# Patient Record
Sex: Male | Born: 1990
Health system: Southern US, Community
[De-identification: ages and names within clinical notes are randomized; demographics above are authoritative.]

## PROBLEM LIST (undated history)

## (undated) DIAGNOSIS — T7840XA Allergy, unspecified, initial encounter: Secondary | ICD-10-CM

## (undated) DIAGNOSIS — F419 Anxiety disorder, unspecified: Secondary | ICD-10-CM

## (undated) HISTORY — DX: Anxiety disorder, unspecified: F41.9

## (undated) HISTORY — DX: Allergy, unspecified, initial encounter: T78.40XA

## (undated) HISTORY — PX: APPENDECTOMY: SHX54

---
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
## 2010-01-21 ENCOUNTER — Encounter: Payer: Self-pay | Admitting: Sports Medicine

## 2010-01-28 ENCOUNTER — Encounter
Admission: RE | Admit: 2010-01-28 | Discharge: 2010-01-28 | Payer: Self-pay | Source: Home / Self Care | Attending: Sports Medicine | Admitting: Sports Medicine

## 2010-01-28 ENCOUNTER — Ambulatory Visit
Admission: RE | Admit: 2010-01-28 | Discharge: 2010-01-28 | Payer: Self-pay | Source: Home / Self Care | Attending: Sports Medicine | Admitting: Sports Medicine

## 2010-01-28 DIAGNOSIS — S93409A Sprain of unspecified ligament of unspecified ankle, initial encounter: Secondary | ICD-10-CM | POA: Insufficient documentation

## 2010-02-10 ENCOUNTER — Encounter (INDEPENDENT_AMBULATORY_CARE_PROVIDER_SITE_OTHER): Payer: Self-pay | Admitting: *Deleted

## 2010-02-26 NOTE — Assessment & Plan Note (Signed)
Summary: BACK PAIN,MC   Vital Signs:  Patient profile:   20 year old male BP sitting:   110 / 63  Vitals Entered By: Lillia Pauls CMA (May 06, 2009 1:54 PM)  History of Present Illness: 20 yo M here with low back pain  Symptoms started at end of March No known injury States feels only in right side of low back Does not wake him up at night. Not associated with numbness/tingling or bowel/bladder dysfunction Pain worse with movement - specifically bending forward and twisting to his right side. Worse when getting up from sitting position Has been icing and stretching on own Had similar issue last year that was more mild and went away on its own Not doing anything with repetitive hyperextension Running track and cross country.  Allergies (verified): No Known Drug Allergies  Physical Exam  General:  Well-developed,well-nourished,in no acute distress; alert,appropriate and cooperative throughout examination Msk:  Back: No gross deformity, swelling, or bruising. no midline or bony TTP.  No TTP at SI joints. Mild right lumbar paraspinal TTP. FROM with pain on twisting R > L and with flexion. Strength 5/5 BLEs Sensation intact to light touch BLEs Reflexes 2+ and equal in patellar and achilles tendons. NV intact distally. Negative SLRs. Negative stork tests Negative log rolls bilateral  hips Positive FABers on left - tight SI joint.  Pretzel stretch + here as well.   Impression & Recommendations:  Problem # 1:  LOW BACK PAIN, MILD (ICD-724.2) Assessment New Musculoskeletal low back pain - no red flags and no risk factors/evidence stress fracture.  Shown stretches and handout provided.  Ibuprofen or aleve for pain.  Heat for 15 minutes at a time 3-4 times a day at least.  Stay as active as possible.  Discussed would refer for physical therapy if he does not improve with this.  Activity as tolerated.  Problem # 2:  SACROILIITIS, LEFT (ICD-720.2) Assessment: New Pretzel  stretch and handout provided - advised to do one other stretch amount piriformis/it band/si stretches and do 3 x 30 seconds at least once a day.

## 2010-02-26 NOTE — Letter (Signed)
Summary: Out of [facility]  [facility]  [address]   [place], [place] [address]   Phone: [phone]  Fax: [phone]    April 04, 2009   Student:  Harmon Pier    To Whom It May Concern:   For Medical reasons, please excuse the above named student from school for the following dates:  Start:   April 04, 2009  End:    April 04, 2009 10:01 AM   If you need additional information, please feel free to contact our office.   Sincerely,    Lillia Pauls CMA    ****This is a legal document and cannot be tampered with.  Schools are authorized to verify all information and to do so accordingly.

## 2010-02-26 NOTE — Consult Note (Signed)
Summary: [facility]  [facility]   Imported By: Marily Memos 01/29/2010 08:40:34  _____________________________________________________________________  External Attachment:    Type:   Image     Comment:   External Document

## 2010-02-26 NOTE — Assessment & Plan Note (Signed)
Summary: F/U,MC   Vital Signs:  Patient profile:   20 year old male BP sitting:   104 / 66  Vitals Entered By: Lillia Pauls CMA (April 04, 2009 9:15 AM)  CC:  right foot pain.  History of Present Illness: Pt here for right foot pain.  This is a new issue, a little different then the previous problems he had with the feet.  The pain has been going on for about 1 week, it is located over the 2nd and 3rd digit and metatarsals, he feels it mostly when he plantar flexes up onto his toes. He says it feels like the bones are collapsing in on each other almost locking up.  Normal walking doesn't seem to hurt it, he hasn't tried jumping.  Says he has done pretty well with his orthotics up until this new issue.  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 Neuro:  Complains of tingling; denies numbness.  Physical Exam  General:  Well-developed,well-nourished,in no acute distress; alert,appropriate and cooperative throughout examination Msk:  b/l hammer toes 5th digit  b/l transverse arch breakdown, more so ont he right, callus formation on plantar surface under halux, no other callusing excessive motion in the 3rd and 4th mtp joint  pt has mild TTP over the dorsal surface of the 3rd MTP and digit pain and weakness while flexing the 3rd digit, 4/5 extension is 5/5 with mild tenderness  ankle ROM full no TTP  strength 5/5 throughout ankle joint Neurologic:  sensation intact Psych:  Cognition and judgment appear intact. Alert and cooperative with normal attention span and concentration.   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

## 2010-02-26 NOTE — Assessment & Plan Note (Signed)
Summary: 11:30 APPT PER [facility],LEFT ANKLE PAIN,MC   Vital Signs:  Patient profile:   20 year old male Height:      75 inches Weight:      190 pounds BMI:     23.83 BP sitting:   123 / 77  Vitals Entered By: Lillia Pauls CMA (January 28, 2010 11:54 AM)   History of Present Illness: 1 week ago had inversion ankle sprain playing basketball This was evaluated at Ortho urgent care no fx noted put into cam walker and has worn since but p 3 days could not walk with this and had to use crutches still marked swelling still too much pain to fully WB  no prior fxs or ankle injuries other than minor sprains  Preventive Screening-Counseling & Management  Alcohol-Tobacco     Smoking Status: never  Allergies (verified): No Known Drug Allergies  Social History: Smoking Status:  never  Physical Exam  General:  Well-developed,well-nourished,in no acute distress; alert,appropriate and cooperative throughout examination Msk:  Marked discoloration and swelling of Left foot, ankle and up to 6 inches above ankle on left TTP over fibula about 6 in above lat malleolus fiib head neg  squeeze test neg  malleolus less tender than fibula  no laxity but difficult to test ligaments 2/2 swelling  no TTP over nav, cuboid or base of 5th  unable to walk even 2 steps  Additional Exam:  MSK [place] swelling noted in soft tissue no fib fracture seen no tendon tears noted over peroneals syndesmoisis appears intact  Ankle and lower leg Xrays repeated and are wnl   Impression & Recommendations:  Problem # 1:  ANKLE SPRAIN, LEFT (ICD-845.00)  While he has + [place] rules this still on evaluation seems likely to be a sprain neg xrays x2; neg MSK [place]  switch to air cast and use until cleared as needed crutch use - advance to crutch walking try xtrain on bike or pool if tolerated begin motion exercises try contrast baths  reck  by me or team MD In 2 wks  Orders: [place] LIMITED ([id number])  Problem  # 2:  LEG PAIN, LEFT (ICD-729.5)  Clinically his exam suggests fibular fx  with neg images will follow unless sxs fail to resolve  air cast does cover tender area  Orders: Diagnostic X-Ray/Fluoroscopy (Diagnostic X-Ray/Flu) Aircast Ankle Brace ([id number]) [place] LIMITED ([id number])   Orders Added: 1)  Diagnostic X-Ray/Fluoroscopy [Diagnostic X-Ray/Flu] 2)  Aircast Ankle Brace [L4350] 3)  Est. Patient Level III [[id number]] 4)  [place] LIMITED [[id number]]

## 2010-02-26 NOTE — Letter (Signed)
Summary: Out of [facility]  [facility]  [address]   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 If you need additional information, please feel free to contact our office.   Sincerely,  Sibyl Parr. Fields, M.D.     ****This is a legal document and cannot be tampered with.  Schools are authorized to verify all information and to do so accordingly.  Appended Document: Out of School faxed the above letter to [employer], trainer at [employer] at [phone]

## 2010-06-12 NOTE — Discharge Summary (Signed)
Todd Mueller, Todd Mueller               ACCOUNT NO.:  [id number]   MEDICAL RECORD NO.:  [id number]          PATIENT TYPE:  INP   LOCATION:  6118                         FACILITY:  [facility]   PHYSICIAN:  Prabhakar D. Pendse, M.D.DATE OF BIRTH:  05/31/1990   DATE OF ADMISSION:  01/04/2005  DATE OF DISCHARGE:  01/06/2005                                 DISCHARGE SUMMARY   This 20 year old boy was seen in the emergency room on January 04, 2005,  with about 36 hours' history in right lower quadrant abdominal pains  associated with nausea, anorexia and vomiting and low-grade fever.  He had  no symptoms of URI, dysuria or diarrhea.  His past history was unremarkable.  When seen by Dr. Merri Brunette at [facility], he had  localizing right lower quadrant tenderness.  CBC was done,which showed a  white count of 13,600, and CT scan of the abdomen was done which showed  evidence of acute appendicitis, and this patient was referred.   Physical examination on admission was consistent with acute abdomen  consistent with acute appendicitis.  The patient was prepared for surgery  and exploratory laparotomy was done.  The operative field were consistent  with localized perforation of the appendix, which was covered with omentum.  The appendectomy was done in the routine fashion.  The abdominal cavity was  irrigated.  Postoperatively the patient was placed on IV Unasyn and  gentamicin.  The patient's postoperative course was uneventful.  He remained  afebrile, started tolerating clear liquids the first postoperative day and  full liquids on the second postoperative day.  The incision showed signs of  primary healing, hence appropriate instructions were given to the parent  regarding the care of the incision and the patient was discharged to be  followed as an outpatient.   FINAL DIAGNOSIS:  Acute appendicitis with localized sealed perforation.   OPERATION PERFORMED:  Exploratory laparotomy  and appendectomy.   CONDITION ON DISCHARGE:  Improved.   Diet and activity as tolerated.   MEDICATIONS:  Motrin for pain and prescription for Tylenol No. 3 one q.6h.  p.r.n.  He will return to office for follow-up examination in one week.           ______________________________  Hyman Bible. Levie Heritage, M.D.     PDP/MEDQ  D:  01/06/2005  T:  01/08/2005  Job:  [id number]   cc:   Dario Guardian, M.D.  Fax: [phone]

## 2010-06-12 NOTE — H&P (Signed)
Todd Mueller, Todd Mueller               ACCOUNT NO.:  [id number]   MEDICAL RECORD NO.:  [id number]          PATIENT TYPE:  INP   LOCATION:  6118                         FACILITY:  [facility]   PHYSICIAN:  Prabhakar D. Pendse, M.D.DATE OF BIRTH:  October 22, 1990   DATE OF ADMISSION:  01/04/2005  DATE OF DISCHARGE:                                HISTORY & PHYSICAL   This 20 year old boy was referred by Dr. Merri Brunette of [facility] group with about 30 hours' history of progressively worse mid and  lower abdominal pains associated with nausea, anorexia and one episode of  vomiting.  The patient had no fever and no other GI or respiratory symptoms.  His past history showed he had adenoidectomy at age 66.  He had fracture of  the left wrist, which was casted about a week ago, and dislocation of the  right little finger.  No other major medical problems.   No drug allergies.   PHYSICAL EXAMINATION:  GENERAL:  A 20 year old boy in moderate degree of  pain in the lower abdomen.  VITAL SIGNS:  His temperature was 98.8, pulse 80, respirations 18, blood  pressure 119/70.  Height was 5 feet 5 inches and weight was 135 pounds.  HEENT:  No evidence of infection.  NECK:  Supple.  CHEST:  Symmetrical.  Lungs clear to percussion and auscultation/  CARDIAC:  Heart size, normal regular rhythm.  No murmurs.  ABDOMEN:  Flat, diffuse tenderness of the lower abdomen with localization of  the right lower quadrant area.  There were no palpable masses.  GENITOURINARY:  External genitalia within normal limits.  RECTAL:  examination not done.   His white count done and Dr. Michaelle Copas office showed total of 13,600 with  shift to the left.  A urinalysis was not reported.  CT scan of the abdomen  done at [facility] showed evidence of acute appendicitis.   ADMITTING DIAGNOSIS:  Acute appendicitis without perforation.   PLAN:  1.  Give IV fluids.  2.  Unasyn 3 g IV.  3.  Exploratory  laparotomy.           ______________________________  Hyman Bible Levie Heritage, M.D.    PDP/MEDQ  D:  01/05/2005  T:  01/06/2005  Job:  [id number]   cc:   Dario Guardian, M.D.  Fax: [phone]

## 2010-06-12 NOTE — Op Note (Signed)
Todd Mueller, Todd Mueller               ACCOUNT NO.:  [id number]   MEDICAL RECORD NO.:  [id number]          PATIENT TYPE:  INP   LOCATION:  6118                         FACILITY:  [facility]   PHYSICIAN:  Prabhakar D. Pendse, M.D.DATE OF BIRTH:  10-19-1990   DATE OF PROCEDURE:  01/04/2005  DATE OF DISCHARGE:                                 OPERATIVE REPORT   PREOPERATIVE DIAGNOSIS:  Acute appendicitis.   POSTOPERATIVE DIAGNOSIS:  Acute appendicitis.   OPERATION PERFORMED:  Exploratory laparotomy and appendectomy.   SURGEON:  Prabhakar D. Levie Heritage, M.D.   ASSISTANT:  Donzetta Sprung, R.N., F.A.   ANESTHESIA:  Nurse.   OPERATIVE FINDINGS:  Exploration of the right lower quadrant area showed the  appendix to be about 4 inches long with omentum wrapped around the appendix  and then a small ulceration of the serosa suggestive of early minimal  perforation.  The examination of the distal ileum showed no evidence of  ileitis or Meckel's diverticulum.   OPERATIVE PROCEDURE:  Under satisfactory general endotracheal anesthesia,  patient in supine position, abdomen thoroughly prepped and draped in the  usual manner.  About a 2 inch long transverse incision was made in the right  lower quadrant area.  Skin and subcutaneous tissue incised.  Bleeders  individually clamped, cut and electrocoagulated.  Muscles incised in the  McBurney fashion.  Peritoneal cavity entered.  Exploration revealed the  findings were as described above.  Initially, it was difficult to find the  appendix because of the marked redundancy of the colon.  Once the appendix  was located, the above-mentioned findings were confirmed.  It was necessary  to extend the incision laterally about 1 cm.  After appropriate exposure,  appendix was delivered out of the incision.  Appendicular mesentery was  serially clamped, cut and ligated with 2-0 silk.  Appendectomy done in the  routine fashion.  Stump was buried in the cecal wall with a 2-0  silk  pursestring suture. Hemostasis accomplished.  The bowel was returned to the  parent cavity. Irrigation was carried out. Sponge and needle count being  correct, peritoneum closed with 2-0 Vicryl running interlocking sutures,  individual muscles with 2-0 Vicryl interrupted  sutures, subcutaneous tissue with 4-0 Vicryl, skin closed with 4-0 Monocryl  subcuticular sutures. Steri-Strips applied.  Appropriate dressing applied.  Throughout the procedure, the patient's vital signs remained stable.  The  patient withstood the procedure well and was transferred to the recovery  room in satisfactory general condition.           ______________________________  Hyman Bible Levie Heritage, M.D.     PDP/MEDQ  D:  01/04/2005  T:  01/05/2005  Job:  [id number]   cc:   Dario Guardian, M.D.  Fax: [phone]

## 2010-11-16 ENCOUNTER — Encounter: Payer: Self-pay | Admitting: Family Medicine

## 2010-11-16 ENCOUNTER — Ambulatory Visit (INDEPENDENT_AMBULATORY_CARE_PROVIDER_SITE_OTHER): Payer: PRIVATE HEALTH INSURANCE | Admitting: Family Medicine

## 2010-11-16 VITALS — BP 119/69 | HR 70 | Ht 75.0 in | Wt 190.0 lb

## 2010-11-16 DIAGNOSIS — Q667 Congenital pes cavus, unspecified foot: Secondary | ICD-10-CM

## 2010-11-16 DIAGNOSIS — M7750 Other enthesopathy of unspecified foot: Secondary | ICD-10-CM

## 2010-11-16 DIAGNOSIS — M7740 Metatarsalgia, unspecified foot: Secondary | ICD-10-CM

## 2010-11-16 NOTE — Progress Notes (Signed)
  Subjective:    Patient ID: Todd Mueller, male    DOB: 30-Jun-1990, 20 y.o.   MRN: [id number]  HPI  Bilateral foot pain and has been well treated with sports insoles and metatarsal pads. He is pretty much worn through the last 2 pair. He is a Psychologist, clinical at [employer].  Review of Systems No unusual weight change.    Objective:   Physical Exam Vital signs reviewed. GENERAL: Well developed, well nourished, no acute distress FEET: Mildly tender to palpation across the metatarsal head area. Loss transverse arch bilaterally. The longitudinal arch is high such that he has a cavus foot..Neurovascularly intact foot.       Assessment & Plan:  Talipes cavus Metatarsalgia. Gave him  2 pair of sports insoles with metatarsal pads.

## 2012-12-20 ENCOUNTER — Encounter: Payer: Self-pay | Admitting: Sports Medicine

## 2012-12-20 ENCOUNTER — Ambulatory Visit (INDEPENDENT_AMBULATORY_CARE_PROVIDER_SITE_OTHER): Payer: PRIVATE HEALTH INSURANCE | Admitting: Sports Medicine

## 2012-12-20 ENCOUNTER — Encounter (INDEPENDENT_AMBULATORY_CARE_PROVIDER_SITE_OTHER): Payer: Self-pay

## 2012-12-20 VITALS — BP 132/69 | HR 81 | Ht 75.0 in | Wt 190.0 lb

## 2012-12-20 DIAGNOSIS — M79609 Pain in unspecified limb: Secondary | ICD-10-CM

## 2012-12-20 DIAGNOSIS — M7740 Metatarsalgia, unspecified foot: Secondary | ICD-10-CM

## 2012-12-20 DIAGNOSIS — M7750 Other enthesopathy of unspecified foot: Secondary | ICD-10-CM

## 2012-12-20 DIAGNOSIS — M2040 Other hammer toe(s) (acquired), unspecified foot: Secondary | ICD-10-CM

## 2012-12-20 DIAGNOSIS — Q667 Congenital pes cavus, unspecified foot: Secondary | ICD-10-CM

## 2012-12-20 NOTE — Assessment & Plan Note (Addendum)
Patient was fitted for a : standard, cushioned, semi-rigid orthotic. The orthotic was heated and afterward the patient stood on the orthotic blank positioned on the orthotic stand. The patient was positioned in subtalar neutral position and 10 degrees of ankle dorsiflexion in a weight bearing stance. After completion of molding, a stable base was applied to the orthotic blank. The blank was ground to a stable position for weight bearing. Size: 14 Base: Red Posting: Blue EVA Additional orthotic padding: None  Patient was comfortable and walking gait was neutral in orthotics  Preparation time was 40 minutes

## 2012-12-20 NOTE — Progress Notes (Signed)
Patient ID: Todd Mueller, male   DOB: 09/15/1990, 22 y.o.   MRN: [id number]  Todd Mueller returns from [facility] for new orthotics  He has had multiple foot problems for many years. This relates to his cavus foot. However as long as we've kept him in custom orthotics for sports we have been able to control most of the pain. He does continue use metatarsal pads for his metatarsalgia. His hammertoes and not worsened.  Physical exam  Athletic male in no acute distress  He has good alignment but has a significant cavus foot bilaterally  Hammertoes are mild and not increased  No abnormal callusing  Normal ankle and foot motion

## 2012-12-20 NOTE — Assessment & Plan Note (Signed)
Mrs. stabilize with good transverse arch support

## 2012-12-20 NOTE — Assessment & Plan Note (Signed)
Metatarsal pads were placed for orthotics and insoles

## 2012-12-20 NOTE — Assessment & Plan Note (Signed)
Using sports insoles in non athletic shoes and this helps control pain

## 2016-11-30 DIAGNOSIS — F419 Anxiety disorder, unspecified: Secondary | ICD-10-CM | POA: Insufficient documentation

## 2018-10-18 ENCOUNTER — Ambulatory Visit (INDEPENDENT_AMBULATORY_CARE_PROVIDER_SITE_OTHER): Payer: 59 | Admitting: Registered Nurse

## 2018-10-18 ENCOUNTER — Other Ambulatory Visit: Payer: Self-pay

## 2018-10-18 ENCOUNTER — Encounter: Payer: Self-pay | Admitting: Registered Nurse

## 2018-10-18 VITALS — BP 129/80 | HR 77 | Temp 98.7°F | Resp 16 | Ht 75.0 in | Wt 186.0 lb

## 2018-10-18 DIAGNOSIS — Z0001 Encounter for general adult medical examination with abnormal findings: Secondary | ICD-10-CM | POA: Diagnosis not present

## 2018-10-18 DIAGNOSIS — Z13228 Encounter for screening for other metabolic disorders: Secondary | ICD-10-CM | POA: Diagnosis not present

## 2018-10-18 DIAGNOSIS — Z23 Encounter for immunization: Secondary | ICD-10-CM | POA: Diagnosis not present

## 2018-10-18 DIAGNOSIS — J3089 Other allergic rhinitis: Secondary | ICD-10-CM | POA: Diagnosis not present

## 2018-10-18 DIAGNOSIS — Z130 Encounter for screening for diseases of the blood and blood-forming organs and certain disorders involving the immune mechanism: Secondary | ICD-10-CM | POA: Diagnosis not present

## 2018-10-18 DIAGNOSIS — Z13220 Encounter for screening for lipoid disorders: Secondary | ICD-10-CM | POA: Diagnosis not present

## 2018-10-18 DIAGNOSIS — Z1329 Encounter for screening for other suspected endocrine disorder: Secondary | ICD-10-CM

## 2018-10-18 DIAGNOSIS — Z0000 Encounter for general adult medical examination without abnormal findings: Secondary | ICD-10-CM

## 2018-10-18 MED ORDER — FLUTICASONE PROPIONATE 50 MCG/ACT NA SUSP: 2.0000 | Freq: Every day | NASAL | 6 refills | Status: AC

## 2018-10-18 MED ORDER — AZELASTINE HCL 0.1 % NA SOLN: 1.0000 | Freq: Two times a day (BID) | NASAL | 12 refills | Status: AC

## 2018-10-18 NOTE — Patient Instructions (Signed)
° ° ° °  If you have lab work done today you will be contacted with your lab results within the next 2 weeks.  If you have not heard from us then please contact us. The fastest way to get your results is to register for My Chart. ° ° °IF you received an x-ray today, you will receive an invoice from [facility]. Please contact [facility] at [phone] with questions or concerns regarding your invoice.  ° °IF you received labwork today, you will receive an invoice from [facility]. Please contact [facility] at [phone] with questions or concerns regarding your invoice.  ° °Our billing staff will not be able to assist you with questions regarding bills from these companies. ° °You will be contacted with the lab results as soon as they are available. The fastest way to get your results is to activate your My Chart account. Instructions are located on the last page of this paperwork. If you have not heard from us regarding the results in 2 weeks, please contact this office. °  ° ° ° °

## 2018-10-18 NOTE — Progress Notes (Signed)
Established Patient Office Visit  Subjective:  Patient ID: Todd Mueller, male    DOB: 02-10-1990  Age: 28 y.o. MRN: [id number]  CC:  Chief Complaint  Patient presents with  . Annual Exam    HPI Todd Mueller presents for annual exam. No current complaints.  No major medical history. 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   Past Medical History:  Diagnosis Date  . Allergy   . Anxiety     Past Surgical History:  Procedure Laterality Date  . APPENDECTOMY      History reviewed. No pertinent family history.  Social History   Socioeconomic History  . Marital status: Married    Spouse name: Not on file  . Number of children: Not on file  . Years of education: Not on file  . Highest education level: Not on file  Occupational History  . Not on file  Social Needs  . Financial resource strain: Not hard at all  . Food insecurity    Worry: Never true    Inability: Never true  . Transportation needs    Medical: No    Non-medical: No  Tobacco Use  . Smoking status: Never Smoker  . Smokeless tobacco: Never Used  Substance and Sexual Activity  . Alcohol use: Not Currently  . Drug use: Not Currently  . Sexual activity: Not on file  Lifestyle  . Physical activity    Days per week: 5 days    Minutes per session: 60 min  . Stress: Only a little  Relationships  . Social Herbalist on phone: Not on file    Gets together: Not on file    Attends religious service: Not on file    Active member of club or organization: Not on file    Attends meetings of clubs or organizations: Not on file    Relationship status: Not on file  . Intimate partner violence    Fear of current or ex partner: Not on file    Emotionally abused: Not on file    Physically abused: Not on file    Forced sexual activity: Not on file  Other Topics Concern  . Not on file  Social History Narrative  . Not on file    Outpatient  Medications Prior to Visit  Medication Sig Dispense Refill  . propranolol (INDERAL) 10 MG tablet Take by mouth.     No facility-administered medications prior to visit.     No Known Allergies  ROS Review of Systems  Constitutional: Negative.   HENT: Negative.   Eyes: Negative.   Respiratory: Negative.   Cardiovascular: Negative.   Gastrointestinal: Negative.   Endocrine: Negative.   Genitourinary: Negative.   Musculoskeletal: Negative.   Skin: Negative.   Allergic/Immunologic: Negative.   Neurological: Negative.   Hematological: Negative.   Psychiatric/Behavioral: Negative.   All other systems reviewed and are negative.     Objective:    Physical Exam  Constitutional: He appears well-developed and well-nourished. No distress.  HENT:  Head: Normocephalic and atraumatic.  Right Ear: External ear normal.  Left Ear: External ear normal.  Nose: Nose normal.  Mouth/Throat: Oropharynx is clear and moist. No oropharyngeal exudate.  Eyes: Pupils are equal, round, and reactive to light. Conjunctivae and EOM are normal. Right eye exhibits no discharge. Left eye exhibits no discharge. No scleral icterus.  Neck: Normal range of motion. Neck supple. No tracheal deviation present. No  thyromegaly present.  Cardiovascular: Normal rate, regular rhythm, normal heart sounds and intact distal pulses. Exam reveals no gallop and no friction rub.  No murmur heard. Pulmonary/Chest: Effort normal and breath sounds normal. No respiratory distress. He has no wheezes. He has no rales. He exhibits no tenderness.  Abdominal: Soft. Bowel sounds are normal. He exhibits no distension and no mass. There is no abdominal tenderness. There is no rebound and no guarding.  Musculoskeletal: Normal range of motion.        General: No tenderness, deformity or edema.  Lymphadenopathy:    He has no cervical adenopathy.  Neurological: He is alert. No cranial nerve deficit. He exhibits normal muscle tone.  Coordination normal.  Skin: Skin is warm and dry. No rash noted. He is not diaphoretic. No erythema. No pallor.  Psychiatric: He has a normal mood and affect. His behavior is normal. Judgment and thought content normal.  Nursing note and vitals reviewed.   BP 129/80   Pulse 77   Temp 98.7 F (37.1 C) (Oral)   Resp 16   Ht '6\' 3"'$  (1.905 m)   Wt 186 lb (84.4 kg)   SpO2 97%   BMI 23.25 kg/m  Wt Readings from Last 3 Encounters:  10/18/18 186 lb (84.4 kg)  12/20/12 190 lb (86.2 kg)  11/16/10 190 lb (86.2 kg)     Health Maintenance Due  Topic Date Due  . HIV Screening  10/19/2005    There are no preventive care reminders to display for this patient.  No results found for: TSH No results found for: WBC, HGB, HCT, MCV, PLT No results found for: NA, K, CHLORIDE, CO2, GLUCOSE, BUN, CREATININE, BILITOT, ALKPHOS, AST, ALT, PROT, ALBUMIN, CALCIUM, ANIONGAP, EGFR, GFR No results found for: CHOL No results found for: HDL No results found for: LDLCALC No results found for: TRIG No results found for: CHOLHDL No results found for: HGBA1C    Assessment & Plan:   Problem List Items Addressed This Visit    None    Visit Diagnoses    Need for influenza vaccination    -  Primary   Relevant Orders   Flu Vaccine QUAD 36+ mos IM (Completed)   Screening for endocrine, metabolic and immunity disorder       Relevant Orders   CBC   Comprehensive metabolic panel   TSH   Lipid screening       Relevant Orders   Lipid panel      No orders of the defined types were placed in this encounter.   Follow-up: No follow-ups on file.   PLAN  Normal findings on exam. Pt is fit to participate in all school activities, both academic and didactic.  Labs drawn, will follow up as warranted  Patient encouraged to call clinic with any questions, comments, or concerns.   Maximiano Coss, NP

## 2018-10-19 LAB — COMPREHENSIVE METABOLIC PANEL
ALT: 24 IU/L (ref 0–44)
AST: 23 IU/L (ref 0–40)
Albumin/Globulin Ratio: 1.9 (ref 1.2–2.2)
Albumin: 4.7 g/dL (ref 4.1–5.2)
Alkaline Phosphatase: 80 IU/L (ref 39–117)
BUN/Creatinine Ratio: 17 (ref 9–20)
BUN: 17 mg/dL (ref 6–20)
Bilirubin Total: 0.8 mg/dL (ref 0.0–1.2)
CO2: 24 mmol/L (ref 20–29)
Calcium: 9.5 mg/dL (ref 8.7–10.2)
Chloride: 102 mmol/L (ref 96–106)
Creatinine, Ser: 1.02 mg/dL (ref 0.76–1.27)
GFR calc Af Amer: 116 mL/min/{1.73_m2} (ref >59)
GFR calc non Af Amer: 100 mL/min/{1.73_m2} (ref >59)
Globulin, Total: 2.5 g/dL (ref 1.5–4.5)
Glucose: 87 mg/dL (ref 65–99)
Potassium: 4.3 mmol/L (ref 3.5–5.2)
Sodium: 141 mmol/L (ref 134–144)
Total Protein: 7.2 g/dL (ref 6.0–8.5)

## 2018-10-19 LAB — CBC
Hematocrit: 46.2 % (ref 37.5–51.0)
Hemoglobin: 15.8 g/dL (ref 13.0–17.7)
MCH: 30.8 pg (ref 26.6–33.0)
MCHC: 34.2 g/dL (ref 31.5–35.7)
MCV: 90 fL (ref 79–97)
Platelets: 190 10*3/uL (ref 150–450)
RBC: 5.13 x10E6/uL (ref 4.14–5.80)
RDW: 13.3 % (ref 11.6–15.4)
WBC: 4.5 10*3/uL (ref 3.4–10.8)

## 2018-10-19 LAB — LIPID PANEL
Chol/HDL Ratio: 3.5 ratio (ref 0.0–5.0)
Cholesterol, Total: 165 mg/dL (ref 100–199)
HDL: 47 mg/dL (ref >39)
LDL Chol Calc (NIH): 104 mg/dL — ABNORMAL HIGH (ref 0–99)
Triglycerides: 70 mg/dL (ref 0–149)
VLDL Cholesterol Cal: 14 mg/dL (ref 5–40)

## 2018-10-19 LAB — TSH: TSH: 1.84 u[IU]/mL (ref 0.450–4.500)

## 2018-10-20 NOTE — Progress Notes (Signed)
[place] -  If you wouldn't mind calling Mr. Kinzler to let him know his labs are back and they are good, that would be great. Additionally, I think he's scheduled for a CPE on 10/25/18, but we were able to get that done at his last visit. We can cancel his 10/25/18 visit. Thank you, Kathrin Ruddy, NP

## 2018-10-25 ENCOUNTER — Encounter: Payer: Self-pay | Admitting: Registered Nurse

## 2018-11-29 ENCOUNTER — Telehealth: Payer: PRIVATE HEALTH INSURANCE | Admitting: Family

## 2018-11-29 DIAGNOSIS — Z20822 Contact with and (suspected) exposure to covid-19: Secondary | ICD-10-CM

## 2018-11-29 DIAGNOSIS — Z20828 Contact with and (suspected) exposure to other viral communicable diseases: Secondary | ICD-10-CM

## 2018-11-29 MED ORDER — BENZONATATE 100 MG PO CAPS: 100.0000 mg | ORAL_CAPSULE | Freq: Three times a day (TID) | ORAL | 0 refills | Status: DC | PRN

## 2018-11-29 NOTE — Progress Notes (Signed)
E-Visit for Corona Virus Screening   Your current symptoms could be consistent with the coronavirus.  Many health care providers can now test patients at their office but not all are.  [place] has multiple testing sites. For information on our COVID testing locations and hours go to [email or web address]  Please quarantine yourself while awaiting your test results.  We are enrolling you in our MyChart Home Montioring for COVID19 . Daily you will receive a questionnaire within the MyChart website. Our COVID 19 response team willl be monitoriing your responses daily.  You can go to one of the testing sites listed below, while they are opened (see hours). You do not need an order and will stay in your car during the test. You do need to self isolate until your results return and if positive 10 days from when your symptoms started and until you are 3 days fever free.   Testing Locations (Monday - Friday, 8 a.m. - 3:30 p.m.) . [place]: [facility], [address], [place], [place]  . [place]: [facility], [address], [place],  (entrance [address])  . [place]: (Closed each Monday): Testing site relocated to the short stay covered drive at [facility]. (Use the [address] entrance to [facility] next to [facility].)   COVID-19 is a respiratory illness with symptoms that are similar to the flu. Symptoms are typically mild to moderate, but there have been cases of severe illness and death due to the virus. The following symptoms may appear 2-14 days after exposure: . Fever . Cough . Shortness of breath or difficulty breathing . Chills . Repeated shaking with chills . Muscle pain . Headache . Sore throat . New loss of taste or smell . Fatigue . Congestion or runny nose . Nausea or vomiting . Diarrhea  It is vitally important that if you feel that you  have an infection such as this virus or any other virus that you stay home and away from places where you may spread it to others.  You should self-quarantine for 14 days if you have symptoms that could potentially be coronavirus or have been in close contact a with a person diagnosed with COVID-19 within the last 2 weeks. You should avoid contact with people age 65 and older.   You should wear a mask or cloth face covering over your nose and mouth if you must be around other people or animals, including pets (even at home). Try to stay at least 6 feet away from other people. This will protect the people around you.  You can use medication such as A prescription cough medication called Tessalon Perles 100 mg. You may take 1-2 capsules every 8 hours as needed for cough.  You may also take acetaminophen (Tylenol) as needed for fever.   Reduce your risk of any infection by using the same precautions used for avoiding the common cold or flu:  . Wash your hands often with soap and warm water for at least 20 seconds.  If soap and water are not readily available, use an alcohol-based hand sanitizer with at least 60% alcohol.  . If coughing or sneezing, cover your mouth and nose by coughing or sneezing into the elbow areas of your shirt or coat, into a tissue or into your sleeve (not your hands). . Avoid shaking hands with others and consider head nods or verbal greetings only. . Avoid touching your   eyes, nose, or mouth with unwashed hands.  . Avoid close contact with people who are sick. . Avoid places or events with large numbers of people in one location, like concerts or sporting events. . Carefully consider travel plans you have or are making. . If you are planning any travel outside or inside the [place], visit the CDC's Travelers' Health webpage for the latest health notices. . If you have some symptoms but not all symptoms, continue to monitor at home and seek medical attention if your symptoms  worsen. . If you are having a medical emergency, call 911.  HOME CARE . Only take medications as instructed by your medical team. . Drink plenty of fluids and get plenty of rest. . A steam or ultrasonic humidifier can help if you have congestion.   GET HELP RIGHT AWAY IF YOU HAVE EMERGENCY WARNING SIGNS** FOR COVID-19. If you or someone is showing any of these signs seek emergency medical care immediately. Call 911 or proceed to your closest emergency facility if: . You develop worsening high fever. . Trouble breathing . Bluish lips or face . Persistent pain or pressure in the chest . New confusion . Inability to wake or stay awake . You cough up blood. . Your symptoms become more severe  **This list is not all possible symptoms. Contact your medical provider for any symptoms that are sever or concerning to you.   MAKE SURE YOU   Understand these instructions.  Will watch your condition.  Will get help right away if you are not doing well or get worse.  Your e-visit answers were reviewed by a board certified advanced clinical practitioner to complete your personal care plan.  Depending on the condition, your plan could have included both over the counter or prescription medications.  If there is a problem please reply once you have received a response from your provider.  Your safety is important to us.  If you have drug allergies check your prescription carefully.    You can use MyChart to ask questions about today's visit, request a non-urgent call back, or ask for a work or school excuse for 24 hours related to this e-Visit. If it has been greater than 24 hours you will need to follow up with your provider, or enter a new e-Visit to address those concerns. You will get an e-mail in the next two days asking about your experience.  I hope that your e-visit has been valuable and will speed your recovery. Thank you for using e-visits.  Approximately 5 minutes was spent documenting  and reviewing patient's chart.    

## 2019-01-08 DIAGNOSIS — S8255XA Nondisplaced fracture of medial malleolus of left tibia, initial encounter for closed fracture: Secondary | ICD-10-CM | POA: Diagnosis not present

## 2019-01-29 DIAGNOSIS — S8255XD Nondisplaced fracture of medial malleolus of left tibia, subsequent encounter for closed fracture with routine healing: Secondary | ICD-10-CM | POA: Diagnosis not present

## 2019-03-12 ENCOUNTER — Ambulatory Visit: Payer: PRIVATE HEALTH INSURANCE | Attending: Internal Medicine

## 2019-03-12 ENCOUNTER — Other Ambulatory Visit: Payer: PRIVATE HEALTH INSURANCE

## 2019-03-12 DIAGNOSIS — Z20822 Contact with and (suspected) exposure to covid-19: Secondary | ICD-10-CM | POA: Diagnosis not present

## 2019-03-13 LAB — NOVEL CORONAVIRUS, NAA: SARS-CoV-2, NAA: NOT DETECTED

## 2019-05-16 ENCOUNTER — Ambulatory Visit: Payer: 59 | Admitting: Registered Nurse

## 2019-05-21 ENCOUNTER — Encounter: Payer: 59 | Admitting: Registered Nurse

## 2019-05-23 ENCOUNTER — Encounter: Payer: Self-pay | Admitting: Registered Nurse

## 2019-05-23 ENCOUNTER — Other Ambulatory Visit: Payer: Self-pay

## 2019-05-23 ENCOUNTER — Ambulatory Visit (INDEPENDENT_AMBULATORY_CARE_PROVIDER_SITE_OTHER): Payer: 59 | Admitting: Registered Nurse

## 2019-05-23 VITALS — BP 118/73 | HR 75 | Temp 97.8°F | Resp 16 | Ht 75.0 in | Wt 182.8 lb

## 2019-05-23 DIAGNOSIS — R0683 Snoring: Secondary | ICD-10-CM

## 2019-05-23 DIAGNOSIS — Z0000 Encounter for general adult medical examination without abnormal findings: Secondary | ICD-10-CM

## 2019-05-23 DIAGNOSIS — Z1159 Encounter for screening for other viral diseases: Secondary | ICD-10-CM

## 2019-05-23 DIAGNOSIS — Z13228 Encounter for screening for other metabolic disorders: Secondary | ICD-10-CM

## 2019-05-23 DIAGNOSIS — Z0001 Encounter for general adult medical examination with abnormal findings: Secondary | ICD-10-CM

## 2019-05-23 DIAGNOSIS — Z1329 Encounter for screening for other suspected endocrine disorder: Secondary | ICD-10-CM | POA: Diagnosis not present

## 2019-05-23 DIAGNOSIS — H547 Unspecified visual loss: Secondary | ICD-10-CM | POA: Diagnosis not present

## 2019-05-23 DIAGNOSIS — Z13220 Encounter for screening for lipoid disorders: Secondary | ICD-10-CM | POA: Diagnosis not present

## 2019-05-23 DIAGNOSIS — Z111 Encounter for screening for respiratory tuberculosis: Secondary | ICD-10-CM | POA: Diagnosis not present

## 2019-05-23 DIAGNOSIS — Z130 Encounter for screening for diseases of the blood and blood-forming organs and certain disorders involving the immune mechanism: Secondary | ICD-10-CM | POA: Diagnosis not present

## 2019-05-23 NOTE — Progress Notes (Signed)
Established Patient Office Visit  Subjective:  Patient ID: Todd Mueller, male    DOB: 07/07/1990  Age: 29 y.o. MRN: [id number]  CC:  Chief Complaint  Patient presents with  . Annual Exam    physical and Tb gold testing.    HPI Todd Mueller presents for CPE and labs for school. Needs TB testing.  2 concerns arose:  Snoring: wakes up with dry mouth, wakes up gasping. Reports witnessed apnea. No headaches on waking. Significant daytime sleepiness every afternoon. Fam hx of much snoring - no one in fam has done sleep study, so no dx OSA.  Also notes loss of visual acuity in R eye more than L. No hx of injury or infection. No headaches, hx of concussion or tbi, or known reason for this to be happening.   Up to date on vaccinations, including covid vaccines   Past Medical History:  Diagnosis Date  . Allergy   . Anxiety     Past Surgical History:  Procedure Laterality Date  . APPENDECTOMY      Family History  Problem Relation Age of Onset  . Seizures Mother   . Healthy Father   . Seizures Brother   . Healthy Son     Social History   Socioeconomic History  . Marital status: Married    Spouse name: Not on file  . Number of children: Not on file  . Years of education: Not on file  . Highest education level: Not on file  Occupational History  . Not on file  Tobacco Use  . Smoking status: Never Smoker  . Smokeless tobacco: Never Used  Substance and Sexual Activity  . Alcohol use: Not Currently  . Drug use: Not Currently  . Sexual activity: Yes  Other Topics Concern  . Not on file  Social History Narrative  . Not on file   Social Determinants of Health   Financial Resource Strain: Low Risk   . Difficulty of Paying Living Expenses: Not hard at all  Food Insecurity: No Food Insecurity  . Worried About Charity fundraiser in the Last Year: Never true  . Ran Out of Food in the Last Year: Never true  Transportation Needs: No Transportation Needs  . Lack  of Transportation (Medical): No  . Lack of Transportation (Non-Medical): No  Physical Activity: Sufficiently Active  . Days of Exercise per Week: 5 days  . Minutes of Exercise per Session: 60 min  Stress: No Stress Concern Present  . Feeling of Stress : Only a little  Social Connections:   . Frequency of Communication with Friends and Family:   . Frequency of Social Gatherings with Friends and Family:   . Attends Religious Services:   . Active Member of Clubs or Organizations:   . Attends Archivist Meetings:   Marland Kitchen Marital Status:   Intimate Partner Violence:   . Fear of Current or Ex-Partner:   . Emotionally Abused:   Marland Kitchen Physically Abused:   . Sexually Abused:     Outpatient Medications Prior to Visit  Medication Sig Dispense Refill  . azelastine (ASTELIN) 0.1 % nasal spray Place 1 spray into both nostrils 2 (two) times daily. Use in each nostril as directed 30 mL 12  . fluticasone (FLONASE) 50 MCG/ACT nasal spray Place 2 sprays into both nostrils daily. 16 g 6  . benzonatate (TESSALON PERLES) 100 MG capsule Take 1 capsule (100 mg total) by mouth 3 (three) times daily as needed. Martorell  capsule 0  . propranolol (INDERAL) 10 MG tablet Take by mouth.     No facility-administered medications prior to visit.    No Known Allergies  ROS Review of Systems  Constitutional: Negative.   HENT: Negative.   Eyes: Negative.   Respiratory: Negative.   Cardiovascular: Negative.   Gastrointestinal: Negative.   Endocrine: Negative.   Genitourinary: Negative.   Musculoskeletal: Negative.   Skin: Negative.   Allergic/Immunologic: Negative.   Neurological: Negative.   Hematological: Negative.   Psychiatric/Behavioral: Negative.   All other systems reviewed and are negative.     Objective:    Physical Exam  Constitutional: He appears well-developed and well-nourished. No distress.  HENT:  Head: Normocephalic and atraumatic.  Right Ear: External ear normal.  Left Ear: External  ear normal.  Nose: Nose normal.  Mouth/Throat: Oropharynx is clear and moist. No oropharyngeal exudate.  Eyes: Pupils are equal, round, and reactive to light. Conjunctivae and EOM are normal. Right eye exhibits no discharge. Left eye exhibits no discharge. No scleral icterus.  Fundoscopic exam:      The right eye shows no arteriolar narrowing, no AV nicking, no exudate, no hemorrhage and no papilledema.       The left eye shows no arteriolar narrowing, no AV nicking, no exudate, no hemorrhage and no papilledema.  Neck: No JVD present. No tracheal deviation present. No thyromegaly present.  Cardiovascular: Normal rate, regular rhythm, normal heart sounds and intact distal pulses. Exam reveals no gallop and no friction rub.  No murmur heard. Pulmonary/Chest: Effort normal and breath sounds normal. No respiratory distress. He has no wheezes. He has no rales. He exhibits no tenderness.  Abdominal: Soft. Bowel sounds are normal. He exhibits no distension and no mass. There is no abdominal tenderness. There is no rebound and no guarding.  Musculoskeletal:     Cervical back: Normal range of motion and neck supple.  Lymphadenopathy:    He has no cervical adenopathy.  Skin: Skin is warm and dry. No rash noted. He is not diaphoretic. No erythema. No pallor.  Psychiatric: He has a normal mood and affect. His behavior is normal. Judgment and thought content normal.  Nursing note and vitals reviewed.   BP 118/73   Pulse 75   Temp 97.8 F (36.6 C) (Temporal)   Resp 16   Ht 6\' 3"  (1.905 m)   Wt 182 lb 12.8 oz (82.9 kg)   SpO2 97%   BMI 22.85 kg/m  Wt Readings from Last 3 Encounters:  05/23/19 182 lb 12.8 oz (82.9 kg)  10/18/18 186 lb (84.4 kg)  12/20/12 190 lb (86.2 kg)     Health Maintenance Due  Topic Date Due  . HIV Screening  Never done    There are no preventive care reminders to display for this patient.  Lab Results  Component Value Date   TSH 1.840 10/18/2018   Lab Results   Component Value Date   WBC 4.5 10/18/2018   HGB 15.8 10/18/2018   HCT 46.2 10/18/2018   MCV 90 10/18/2018   PLT 190 10/18/2018   Lab Results  Component Value Date   NA 141 10/18/2018   K 4.3 10/18/2018   CO2 24 10/18/2018   GLUCOSE 87 10/18/2018   BUN 17 10/18/2018   CREATININE 1.02 10/18/2018   BILITOT 0.8 10/18/2018   ALKPHOS 80 10/18/2018   AST 23 10/18/2018   ALT 24 10/18/2018   PROT 7.2 10/18/2018   ALBUMIN 4.7 10/18/2018   CALCIUM 9.5  10/18/2018   Lab Results  Component Value Date   CHOL 165 10/18/2018   Lab Results  Component Value Date   HDL 47 10/18/2018   Lab Results  Component Value Date   LDLCALC 104 (H) 10/18/2018   Lab Results  Component Value Date   TRIG 70 10/18/2018   Lab Results  Component Value Date   CHOLHDL 3.5 10/18/2018   No results found for: HGBA1C    Assessment & Plan:   Problem List Items Addressed This Visit    None    Visit Diagnoses    Annual physical exam    -  Primary   Screening for endocrine, metabolic and immunity disorder       Relevant Orders   TSH   CBC with Differential   Hemoglobin A1c   Screening-pulmonary TB       Relevant Orders   QuantiFERON-TB Gold Plus   Screening for viral disease       Relevant Orders   HIV antibody (with reflex)   Lipid screening       Relevant Orders   Lipid panel   Snoring       Relevant Orders   Ambulatory referral to Pulmonology   Decreased visual acuity       Relevant Orders   Ambulatory referral to Ophthalmology      No orders of the defined types were placed in this encounter.   Follow-up: No follow-ups on file.   PLAN  No abnormal findings on exam - fundoscopic exam also wnl.  Will refer to ophth for decreased visual acuity, pulmonology for suspected OSA  Labs collected, will follow up as warranted  Patient encouraged to call clinic with any questions, comments, or concerns.  Janeece Agee, NP

## 2019-05-23 NOTE — Patient Instructions (Signed)
° ° ° °  If you have lab work done today you will be contacted with your lab results within the next 2 weeks.  If you have not heard from us then please contact us. The fastest way to get your results is to register for My Chart. ° ° °IF you received an x-ray today, you will receive an invoice from [facility]. Please contact [facility] at [phone] with questions or concerns regarding your invoice.  ° °IF you received labwork today, you will receive an invoice from [facility]. Please contact [facility] at [phone] with questions or concerns regarding your invoice.  ° °Our billing staff will not be able to assist you with questions regarding bills from these companies. ° °You will be contacted with the lab results as soon as they are available. The fastest way to get your results is to activate your My Chart account. Instructions are located on the last page of this paperwork. If you have not heard from us regarding the results in 2 weeks, please contact this office. °  ° ° ° °

## 2019-05-25 LAB — LIPID PANEL
Chol/HDL Ratio: 3.2 ratio (ref 0.0–5.0)
Cholesterol, Total: 127 mg/dL (ref 100–199)
HDL: 40 mg/dL (ref >39)
LDL Chol Calc (NIH): 73 mg/dL (ref 0–99)
Triglycerides: 68 mg/dL (ref 0–149)
VLDL Cholesterol Cal: 14 mg/dL (ref 5–40)

## 2019-05-25 LAB — HEMOGLOBIN A1C
Est. average glucose Bld gHb Est-mCnc: 105 mg/dL
Hgb A1c MFr Bld: 5.3 % (ref 4.8–5.6)

## 2019-05-25 LAB — CBC WITH DIFFERENTIAL/PLATELET
Basophils Absolute: 0 10*3/uL (ref 0.0–0.2)
Basos: 1 %
EOS (ABSOLUTE): 0.2 10*3/uL (ref 0.0–0.4)
Eos: 6 %
Hematocrit: 45.7 % (ref 37.5–51.0)
Hemoglobin: 15.7 g/dL (ref 13.0–17.7)
Immature Grans (Abs): 0 10*3/uL (ref 0.0–0.1)
Immature Granulocytes: 0 %
Lymphocytes Absolute: 1.4 10*3/uL (ref 0.7–3.1)
Lymphs: 44 %
MCH: 31.2 pg (ref 26.6–33.0)
MCHC: 34.4 g/dL (ref 31.5–35.7)
MCV: 91 fL (ref 79–97)
Monocytes Absolute: 0.3 10*3/uL (ref 0.1–0.9)
Monocytes: 9 %
Neutrophils Absolute: 1.3 10*3/uL — ABNORMAL LOW (ref 1.4–7.0)
Neutrophils: 40 %
Platelets: 193 10*3/uL (ref 150–450)
RBC: 5.03 x10E6/uL (ref 4.14–5.80)
RDW: 13.1 % (ref 11.6–15.4)
WBC: 3.2 10*3/uL — ABNORMAL LOW (ref 3.4–10.8)

## 2019-05-25 LAB — TSH: TSH: 1.6 u[IU]/mL (ref 0.450–4.500)

## 2019-05-25 LAB — QUANTIFERON-TB GOLD PLUS
QuantiFERON Mitogen Value: >10 IU/mL
QuantiFERON Nil Value: 0.07 IU/mL
QuantiFERON TB1 Ag Value: 0.07 IU/mL
QuantiFERON TB2 Ag Value: 0.06 IU/mL
QuantiFERON-TB Gold Plus: NEGATIVE

## 2019-05-25 LAB — HIV ANTIBODY (ROUTINE TESTING W REFLEX): HIV Screen 4th Generation wRfx: NONREACTIVE

## 2019-05-30 ENCOUNTER — Encounter: Payer: Self-pay | Admitting: Registered Nurse

## 2019-07-23 ENCOUNTER — Ambulatory Visit (INDEPENDENT_AMBULATORY_CARE_PROVIDER_SITE_OTHER): Payer: 59 | Admitting: Pulmonary Disease

## 2019-07-23 ENCOUNTER — Other Ambulatory Visit: Payer: Self-pay

## 2019-07-23 ENCOUNTER — Encounter: Payer: Self-pay | Admitting: Pulmonary Disease

## 2019-07-23 DIAGNOSIS — J342 Deviated nasal septum: Secondary | ICD-10-CM | POA: Diagnosis not present

## 2019-07-23 MED ORDER — MOMETASONE FUROATE 50 MCG/ACT NA SUSP: 2.0000 | Freq: Every evening | NASAL | 1 refills | Status: DC

## 2019-07-23 NOTE — Assessment & Plan Note (Addendum)
deviated nasal septum Trial of Nasonex spray -each nare at bedtime for 4 weeks Consider take only if systemic symptoms arise in 1  Red flags for obstructive sleep apnea would be -Loud snoring that wakes you up per family members -Nonrefreshing sleep -Gasping or choking episodes during sleep  At this time, he does not have any of the above so we can hold off on formal sleep testing.  We will await further history from his wife and see if that changes the story

## 2019-07-23 NOTE — Progress Notes (Signed)
Subjective:    Patient ID: Todd Mueller, male    DOB: 1991/01/03, 29 y.o.   MRN: [id number]  HPI   Chief Complaint  Patient presents with  . 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 He was a Land during college, decathlon, now has transition to running and cycling. His main complaint is breathing issues during sleep.  He wakes up about 4 nights out of a week about 1-3 times feeling like his nose is stopped up and he has to take a deep breath to breathe.  Reports dryness of his nose.  Wife has noted snoring but this is been the same for the last 2 years.  He tried Flonase over-the-counter and Allegra without much relief. Epworth sleepiness score is 4 and his sleepiness time is in the early afternoon after heavy meal.  He denies frequent nasal congestion or sinusitis.  Bedtime is between 10 and 11 PM, sleep latency is about 30 minutes, he generally sleeps on his stomach on his back, reports 2-3 nocturnal awakenings, denies nocturia is out of bed by 7 AM feeling rested without dryness of mouth or headaches.  He has lost 10 pounds in the last 2 years There is no history suggestive of cataplexy, sleep paralysis or parasomnias Drinks 1 cup of coffee daily and denies excessive use of caffeinated beverages   Past Medical History:  Diagnosis Date  . Allergy   . Anxiety     Past Surgical History:  Procedure Laterality Date  . APPENDECTOMY      No Known Allergies  Social History   Socioeconomic History  . Marital status: Married    Spouse name: Not on file  . Number of children: Not on file  . Years of education: Not on file  . Highest education level: Not on file  Occupational History  . Not on file  Tobacco Use  . Smoking status: Never Smoker  . Smokeless tobacco: Never Used  Vaping Use  . Vaping Use: Never used  Substance and Sexual Activity  . Alcohol  use: Not Currently  . Drug use: Not Currently  . Sexual activity: Yes  Other Topics Concern  . Not on file  Social History Narrative  . Not on file   Social Determinants of Health   Financial Resource Strain: Low Risk   . Difficulty of Paying Living Expenses: Not hard at all  Food Insecurity: No Food Insecurity  . Worried About Programme researcher, broadcasting/film/video in the Last Year: Never true  . Ran Out of Food in the Last Year: Never true  Transportation Needs: No Transportation Needs  . Lack of Transportation (Medical): No  . Lack of Transportation (Non-Medical): No  Physical Activity: Sufficiently Active  . Days of Exercise per Week: 5 days  . Minutes of Exercise per Session: 60 min  Stress: No Stress Concern Present  . Feeling of Stress : Only a little  Social Connections:   . Frequency of Communication with Friends and Family:   . Frequency of Social Gatherings with Friends and Family:   . Attends Religious Services:   . Active Member of Clubs or Organizations:   . Attends Banker Meetings:   Marland Kitchen Marital Status:   Intimate Partner Violence:   . Fear of Current or Ex-Partner:   . Emotionally Abused:   Marland Kitchen Physically Abused:   . Sexually Abused:  Family History  Problem Relation Age of Onset  . Seizures Mother   . Healthy Father   . Seizures Brother   . Healthy Son       Review of Systems Constitutional: negative for anorexia, fevers and sweats  Eyes: negative for irritation, redness and visual disturbance  Ears, nose, mouth, throat, and face: negative for earaches, epistaxis, nasal congestion and sore throat  Respiratory: negative for cough, dyspnea on exertion, sputum and wheezing  Cardiovascular: negative for chest pain, dyspnea, lower extremity edema, orthopnea, palpitations and syncope  Gastrointestinal: negative for abdominal pain, constipation, diarrhea, melena, nausea and vomiting  Genitourinary:negative for dysuria, frequency and hematuria   Hematologic/lymphatic: negative for bleeding, easy bruising and lymphadenopathy  Musculoskeletal:negative for arthralgias, muscle weakness and stiff joints  Neurological: negative for coordination problems, gait problems, headaches and weakness  Endocrine: negative for diabetic symptoms including polydipsia, polyuria and weight loss     Objective:   Physical Exam Gen. Pleasant, well-nourished, in no distress ENT - no thrush, no pallor/icterus,no post nasal drip, septum deviated to right Neck: No JVD, no thyromegaly, no carotid bruits Lungs: no use of accessory muscles, no dullness to percussion, clear without rales or rhonchi  Cardiovascular: Rhythm regular, heart sounds  normal, no murmurs or gallops, no peripheral edema Musculoskeletal: No deformities, no cyanosis or clubbing         Assessment & Plan:

## 2019-07-23 NOTE — Patient Instructions (Signed)
You have deviated nasal septum Trial of Nasonex spray -each nare at bedtime for 4 weeks  Red flags for obstructive sleep apnea would be -Loud snoring that wakes you up per family members -Nonrefreshing sleep -Gasping or choking episodes during sleep

## 2019-12-04 DIAGNOSIS — Z23 Encounter for immunization: Secondary | ICD-10-CM | POA: Diagnosis not present

## 2020-01-28 MED ORDER — MOMETASONE FUROATE 50 MCG/ACT NA SUSP: 2.0000 | Freq: Every evening | NASAL | 1 refills | Status: AC

## 2020-02-29 ENCOUNTER — Ambulatory Visit: Payer: 59 | Admitting: Registered Nurse

## 2020-04-15 DIAGNOSIS — J3089 Other allergic rhinitis: Secondary | ICD-10-CM | POA: Diagnosis not present

## 2020-04-15 DIAGNOSIS — J301 Allergic rhinitis due to pollen: Secondary | ICD-10-CM | POA: Diagnosis not present

## 2020-04-15 DIAGNOSIS — J309 Allergic rhinitis, unspecified: Secondary | ICD-10-CM | POA: Diagnosis not present

## 2020-04-15 DIAGNOSIS — R059 Cough, unspecified: Secondary | ICD-10-CM | POA: Diagnosis not present

## 2020-06-12 ENCOUNTER — Other Ambulatory Visit (HOSPITAL_COMMUNITY): Payer: Self-pay

## 2020-06-12 MED ORDER — LEVOCETIRIZINE DIHYDROCHLORIDE 5 MG PO TABS
5.0000 mg | ORAL_TABLET | Freq: Every evening | ORAL | 2 refills | Status: AC
  Filled 2020-06-12: qty 30, 30d supply, fill #0

## 2020-06-12 MED ORDER — MONTELUKAST SODIUM 10 MG PO TABS
10.0000 mg | ORAL_TABLET | Freq: Every day | ORAL | 2 refills | Status: AC
  Filled 2020-06-12: qty 30, 30d supply, fill #0

## 2020-06-13 ENCOUNTER — Other Ambulatory Visit (HOSPITAL_COMMUNITY): Payer: Self-pay

## 2020-06-13 MED ORDER — AZITHROMYCIN 250 MG PO TABS
500.0000 mg | ORAL_TABLET | Freq: Every day | ORAL | 0 refills | Status: AC
  Filled 2020-06-13: qty 6, 3d supply, fill #0

## 2020-06-13 MED ORDER — ATOVAQUONE-PROGUANIL HCL 250-100 MG PO TABS
1.0000 | ORAL_TABLET | Freq: Every day | ORAL | 0 refills | Status: AC
  Filled 2020-06-13: qty 16, 16d supply, fill #0

## 2021-02-10 ENCOUNTER — Other Ambulatory Visit: Payer: Self-pay

## 2021-03-20 ENCOUNTER — Other Ambulatory Visit: Payer: Self-pay

## 2021-08-12 ENCOUNTER — Other Ambulatory Visit (HOSPITAL_COMMUNITY): Payer: Self-pay

## 2021-08-12 MED ORDER — BETAMETHASONE DIPROPIONATE AUG 0.05 % EX CREA
TOPICAL_CREAM | Freq: Two times a day (BID) | CUTANEOUS | 0 refills | Status: AC
  Filled 2021-08-12 – 2021-08-21 (×2): qty 50, 30d supply, fill #0

## 2021-08-20 ENCOUNTER — Other Ambulatory Visit (HOSPITAL_COMMUNITY): Payer: Self-pay

## 2021-08-21 ENCOUNTER — Other Ambulatory Visit (HOSPITAL_COMMUNITY): Payer: Self-pay

## 2021-08-26 ENCOUNTER — Other Ambulatory Visit (HOSPITAL_COMMUNITY): Payer: Self-pay

## 2022-01-08 ENCOUNTER — Other Ambulatory Visit (HOSPITAL_BASED_OUTPATIENT_CLINIC_OR_DEPARTMENT_OTHER): Payer: Self-pay

## 2022-01-08 MED ORDER — FLUTICASONE PROPIONATE 50 MCG/ACT NA SUSP
NASAL | 1 refills | Status: AC
  Filled 2022-01-08 – 2022-01-19 (×3): qty 16, 30d supply, fill #0

## 2022-01-08 MED ORDER — AZELASTINE HCL 0.1 % NA SOLN
1.0000 | Freq: Two times a day (BID) | NASAL | 1 refills | Status: AC
  Filled 2022-01-08 – 2022-01-19 (×3): qty 30, 30d supply, fill #0

## 2022-01-13 ENCOUNTER — Other Ambulatory Visit (HOSPITAL_BASED_OUTPATIENT_CLINIC_OR_DEPARTMENT_OTHER): Payer: Self-pay

## 2022-01-14 ENCOUNTER — Other Ambulatory Visit (HOSPITAL_BASED_OUTPATIENT_CLINIC_OR_DEPARTMENT_OTHER): Payer: Self-pay

## 2022-01-19 ENCOUNTER — Other Ambulatory Visit (HOSPITAL_BASED_OUTPATIENT_CLINIC_OR_DEPARTMENT_OTHER): Payer: Self-pay

## 2022-01-19 ENCOUNTER — Other Ambulatory Visit (HOSPITAL_COMMUNITY): Payer: Self-pay

## 2022-02-04 ENCOUNTER — Other Ambulatory Visit (HOSPITAL_COMMUNITY): Payer: Self-pay

## 2022-02-04 ENCOUNTER — Other Ambulatory Visit (HOSPITAL_BASED_OUTPATIENT_CLINIC_OR_DEPARTMENT_OTHER): Payer: Self-pay

## 2022-02-21 ENCOUNTER — Other Ambulatory Visit (HOSPITAL_BASED_OUTPATIENT_CLINIC_OR_DEPARTMENT_OTHER): Payer: Self-pay

## 2022-02-21 MED ORDER — MUPIROCIN 2 % EX OINT
TOPICAL_OINTMENT | CUTANEOUS | 0 refills | Status: AC
  Filled 2022-02-21: qty 22, 10d supply, fill #0

## 2022-02-22 ENCOUNTER — Other Ambulatory Visit: Payer: Self-pay

## 2022-03-03 ENCOUNTER — Other Ambulatory Visit (HOSPITAL_BASED_OUTPATIENT_CLINIC_OR_DEPARTMENT_OTHER): Payer: Self-pay
